# Patient Record
Sex: Female | Born: 1965 | Race: White | Hispanic: No | Marital: Married
Health system: Southern US, Community
[De-identification: ages and names within clinical notes are randomized; demographics above are authoritative.]

---

## 2004-02-05 ENCOUNTER — Ambulatory Visit: Payer: Self-pay

## 2009-01-08 ENCOUNTER — Ambulatory Visit: Payer: Self-pay

## 2010-11-28 ENCOUNTER — Ambulatory Visit: Payer: Self-pay | Admitting: Obstetrics and Gynecology

## 2010-12-04 ENCOUNTER — Ambulatory Visit: Payer: Self-pay | Admitting: Obstetrics and Gynecology

## 2011-06-19 ENCOUNTER — Ambulatory Visit: Payer: Self-pay | Admitting: Obstetrics and Gynecology

## 2012-11-09 ENCOUNTER — Ambulatory Visit: Payer: Self-pay

## 2014-01-11 ENCOUNTER — Ambulatory Visit: Payer: Self-pay

## 2014-01-23 ENCOUNTER — Ambulatory Visit: Payer: Self-pay

## 2015-01-03 ENCOUNTER — Other Ambulatory Visit: Payer: Self-pay | Admitting: Obstetrics and Gynecology

## 2015-01-03 DIAGNOSIS — Z1231 Encounter for screening mammogram for malignant neoplasm of breast: Secondary | ICD-10-CM

## 2015-01-11 ENCOUNTER — Ambulatory Visit
Admission: RE | Admit: 2015-01-11 | Discharge: 2015-01-11 | Disposition: A | Discharge status: Home / Self Care | Payer: BLUE CROSS/BLUE SHIELD | Source: Ambulatory Visit | Attending: Obstetrics and Gynecology | Admitting: Obstetrics and Gynecology

## 2015-01-11 DIAGNOSIS — Z1231 Encounter for screening mammogram for malignant neoplasm of breast: Secondary | ICD-10-CM | POA: Diagnosis not present

## 2016-01-09 ENCOUNTER — Ambulatory Visit: Payer: Self-pay | Attending: Oncology | Admitting: *Deleted

## 2016-01-09 ENCOUNTER — Ambulatory Visit
Admission: RE | Admit: 2016-01-09 | Discharge: 2016-01-09 | Disposition: A | Discharge status: Home / Self Care | Payer: Self-pay | Source: Ambulatory Visit | Attending: Oncology | Admitting: Oncology

## 2016-01-09 VITALS — BP 124/73 | HR 69 | Temp 96.7°F | Ht 68.11 in | Wt 204.4 lb

## 2016-01-09 DIAGNOSIS — Z0000 Encounter for general adult medical examination without abnormal findings: Secondary | ICD-10-CM

## 2016-01-09 NOTE — Progress Notes (Signed)
Subjective:     Patient ID: Brien Fewracy Gregory Corkum, female   DOB: 03/22/1965, 50 y.o.   MRN: [id number]  HPI   Review of Systems     Objective:   Physical Exam  Pulmonary/Chest: Right breast exhibits no inverted nipple, no mass, no nipple discharge, no skin change and no tenderness. Left breast exhibits no inverted nipple, no mass, no nipple discharge, no skin change and no tenderness. Breasts are symmetrical.         Assessment:     50 year old White [facility], pap and mammogram.  Clinical breast exam unremarkable.  Taught self breast awareness.  Pap delayed this week due to the patient is currently spotting.  States she had a novasure procedure and she occasionally spots.  She is to call back next week so we can reschedule her pap when she is not spotting.  She is agreeable.  Patient has been screened for eligibility.  She does not have any insurance, Medicare or Medicaid.  She also meets financial eligibility.  Hand-out given on the Affordable Care Act.    Plan:     Screening mammogram ordered.  Will follow-up per [facility] protocol.

## 2016-01-10 ENCOUNTER — Encounter: Payer: Self-pay | Admitting: *Deleted

## 2016-01-10 NOTE — Progress Notes (Signed)
Letter mailed from the [facility] to inform patient of her normal mammogram results.  Patient is to follow-up with annual screening in one year.  [facility] to [place]. 

## 2016-01-23 ENCOUNTER — Ambulatory Visit: Payer: Self-pay | Attending: Oncology | Admitting: *Deleted

## 2016-01-23 ENCOUNTER — Encounter: Payer: Self-pay | Admitting: *Deleted

## 2016-01-23 DIAGNOSIS — Z0000 Encounter for general adult medical examination without abnormal findings: Secondary | ICD-10-CM

## 2016-01-23 NOTE — Patient Instructions (Signed)
  HPV Test The human papillomavirus (HPV) test is used to look for high-risk types of HPV infection. HPV is a group of about 100 viruses. Many of these viruses cause growths on, in, or around the genitals. Most HPV viruses cause infections that usually go away without treatment. However, HPV types 6, 11, 16, and 18 are considered high-risk types of HPV that can increase your risk of cancer of the cervix or anus if the infection is left untreated. An HPV test identifies the DNA (genetic) strands of the HPV infection, so it is also referred to as the HPV DNA test. Although HPV is found in both males and females, the HPV test is only used to screen for increased cancer risk in females:  With an abnormal Pap test.  After treatment of an abnormal Pap test.  Between the ages of 30 and 65.  After treatment of a high-risk HPV infection.  The HPV test may be done at the same time as a pelvic exam and Pap test in females over the age of 30. Both the HPV test and Pap test require a sample of cells from the cervix. How do I prepare for this test?  Do not douche or take a bath for 24-48 hours before the test or as directed by your health care provider.  Do not have sex for 24-48 hours before the test or as directed by your health care provider.  You may be asked to reschedule the test if you are menstruating.  You will be asked to urinate before the test. What do the results mean? It is your responsibility to obtain your test results. Ask the lab or department performing the test when and how you will get your results. Talk with your health care provider if you have any questions about your results. Your result will be negative or positive. Meaning of Negative Test Results A negative HPV test result means that no HPV was found, and it is very likely that you do not have HPV. Meaning of Positive Test Results A positive HPV test result indicates that you have HPV.  If your test result shows the  presence of any high-risk HPV strains, you may have an increased risk of developing cancer of the cervix or anus if the infection is left untreated.  If any low-risk HPV strains are found, you are not likely to have an increased risk of cancer.  Discuss your test results with your health care provider. He or she will use the results to make a diagnosis and determine a treatment plan that is right for you. Talk with your health care provider to discuss your results, treatment options, and if necessary, the need for more tests. Talk with your health care provider if you have any questions about your results. This information is not intended to replace advice given to you by your health care provider. Make sure you discuss any questions you have with your health care provider. Document Released: 01/25/2004 Document Revised: 09/05/2015 Document Reviewed: 05/17/2013 Elsevier Interactive Patient Education  2017 [employer]  

## 2016-01-23 NOTE — Progress Notes (Signed)
Subjective:     Patient ID: Brenda Sexton, female   DOB: 03/09/1965, 51 y.o.   MRN: [id number]  HPI   Review of Systems     Objective:   Physical Exam  Genitourinary: No labial fusion. There is no rash, tenderness, lesion or injury on the right labia. There is no rash, tenderness, lesion or injury on the left labia. Right adnexum displays no mass, no tenderness and no fullness. Left adnexum displays no mass, no tenderness and no fullness. No erythema, tenderness or bleeding in the vagina. No foreign body in the vagina. No signs of injury around the vagina. No vaginal discharge found.         Assessment:     51 [facility]  Specimen collected for pap without difficulty.  Informed of her normal mammogram results on 01/09/16.  Hand-out given on HPV.       Plan:     Specimen sent to the lab.  Will follow-up per [facility] protocol.

## 2016-01-25 LAB — PAP LB AND HPV HIGH-RISK
HPV, high-risk: NEGATIVE
PAP SMEAR COMMENT: 0

## 2016-01-28 ENCOUNTER — Encounter: Payer: Self-pay | Admitting: *Deleted

## 2016-01-28 NOTE — Progress Notes (Signed)
Letter mailed to inform patient of her normal pap smear results.  Next pap will be in 5 years.  [facility] [place]

## 2017-04-27 ENCOUNTER — Ambulatory Visit: Payer: Self-pay | Attending: Oncology

## 2017-04-27 ENCOUNTER — Ambulatory Visit
Admission: RE | Admit: 2017-04-27 | Discharge: 2017-04-27 | Disposition: A | Discharge status: Home / Self Care | Payer: Self-pay | Source: Ambulatory Visit | Attending: Oncology | Admitting: Oncology

## 2017-04-27 VITALS — BP 139/83 | HR 73 | Temp 98.3°F | Ht 67.72 in | Wt 182.2 lb

## 2017-04-27 DIAGNOSIS — Z0000 Encounter for general adult medical examination without abnormal findings: Secondary | ICD-10-CM

## 2017-04-27 NOTE — Progress Notes (Signed)
Subjective:     Patient ID: Brenda Sexton, female   DOB: 01/05/1966, 52 y.o.   MRN: [id number]  HPI   Review of Systems     Objective:   Physical Exam  Pulmonary/Chest: Right breast exhibits no inverted nipple, no mass, no nipple discharge, no skin change and no tenderness. Left breast exhibits no inverted nipple, no mass, no nipple discharge, no skin change and no tenderness. Breasts are symmetrical.       Assessment:     52 [facility].  Patient screened, and meets [facility] eligibility.  Patient does not have insurance, Medicare or Medicaid.  Handout given on Affordable Care Act.  Instructed patient on breast self awareness using teach back method.  Clinical Breast Exam unremarkable.  No mass or lump palpated.  Patient to see Primary caregiver for physical.    Plan:     Sent for bilateral screening mammogram.

## 2017-04-29 NOTE — Progress Notes (Signed)
Letter mailed from [facility] to notify of normal mammogram results.  Patient to return in one year for annual screening.  Copy to [facility]IS. 

## 2019-07-20 ENCOUNTER — Other Ambulatory Visit: Payer: Self-pay

## 2019-07-20 ENCOUNTER — Ambulatory Visit
Admission: RE | Admit: 2019-07-20 | Discharge: 2019-07-20 | Disposition: A | Discharge status: Home / Self Care | Payer: Self-pay | Source: Ambulatory Visit | Attending: Oncology | Admitting: Oncology

## 2019-07-20 ENCOUNTER — Ambulatory Visit: Payer: Self-pay | Attending: Oncology

## 2019-07-20 VITALS — BP 124/75 | HR 75 | Temp 98.6°F | Ht 67.5 in | Wt 200.0 lb

## 2019-07-20 DIAGNOSIS — Z0000 Encounter for general adult medical examination without abnormal findings: Secondary | ICD-10-CM | POA: Insufficient documentation

## 2019-07-20 NOTE — Progress Notes (Signed)
  Subjective:     Patient ID: Brien Few, female   DOB: 1965-09-16, 54 y.o.   MRN: [id number]  HPI   Review of Systems     Objective:   Physical Exam Chest:     Breasts:        Right: No swelling, bleeding, inverted nipple, mass, nipple discharge, skin change or tenderness.        Left: No swelling, bleeding, inverted nipple, mass, nipple discharge, skin change or tenderness.        Assessment:     54 year old patient presents for [facility] clinic visit.  Patient screened, and meets [facility] eligibility.  Patient does not have insurance, Medicare or Medicaid. Instructed patient on breast self awareness using teach back method.  Clinical breast exam unremarkable. No mass or lump palpated.   Risk Assessment    Risk Scores      07/20/2019   Last edited by: Alta Corning, CMA   5-year risk: 0.9 %   Lifetime risk: 7.8 %            Plan:     Sent for bilateral screening mammogram.

## 2019-07-26 NOTE — Progress Notes (Signed)
Letter mailed from [facility] to notify of normal mammogram results.  Patient to return in one year for annual screening.  Copy to [facility]IS. 

## 2020-12-30 IMAGING — MG DIGITAL SCREENING BILAT W/ TOMO W/ CAD
8 series · 8 of 24 positions shown · non-contrast
Comparison: Previous exam(s).

CLINICAL DATA: Screening.

EXAM:
DIGITAL SCREENING BILATERAL MAMMOGRAM WITH TOMO AND CAD

[R CC synth-2D]
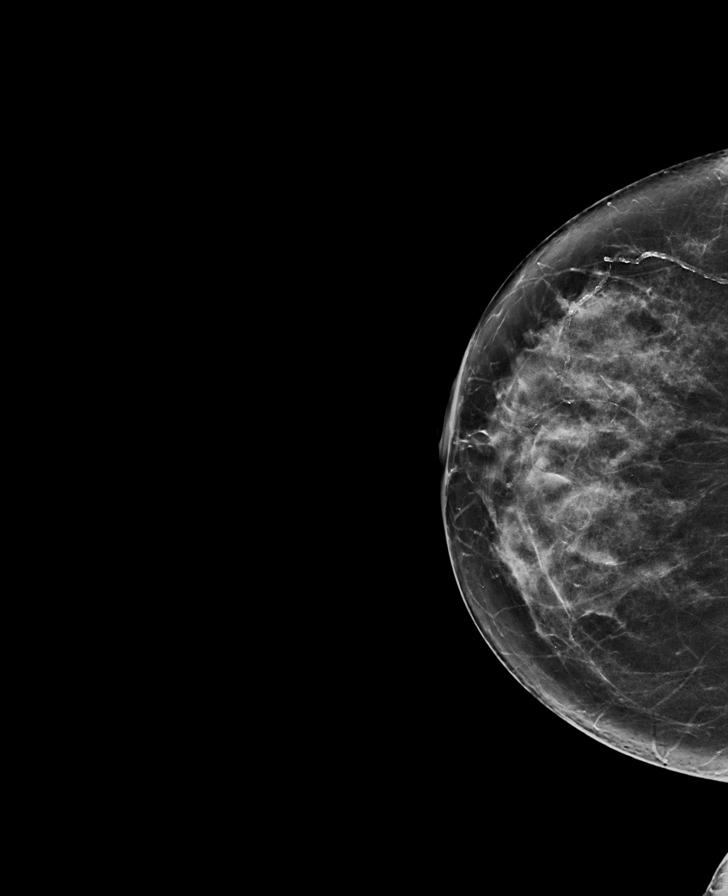

[L CC synth-2D]
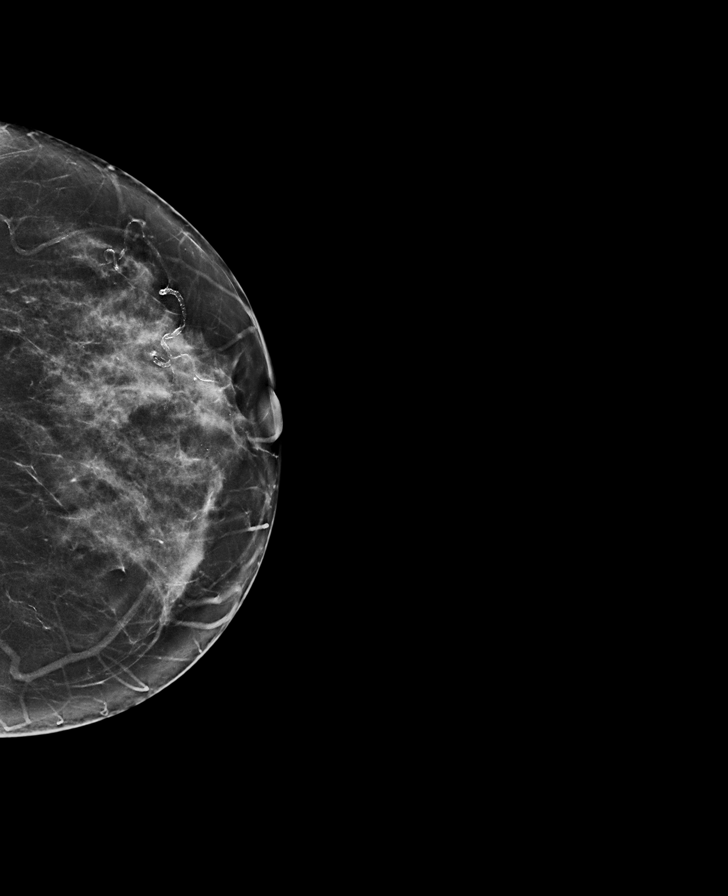

[L MLO synth-2D]
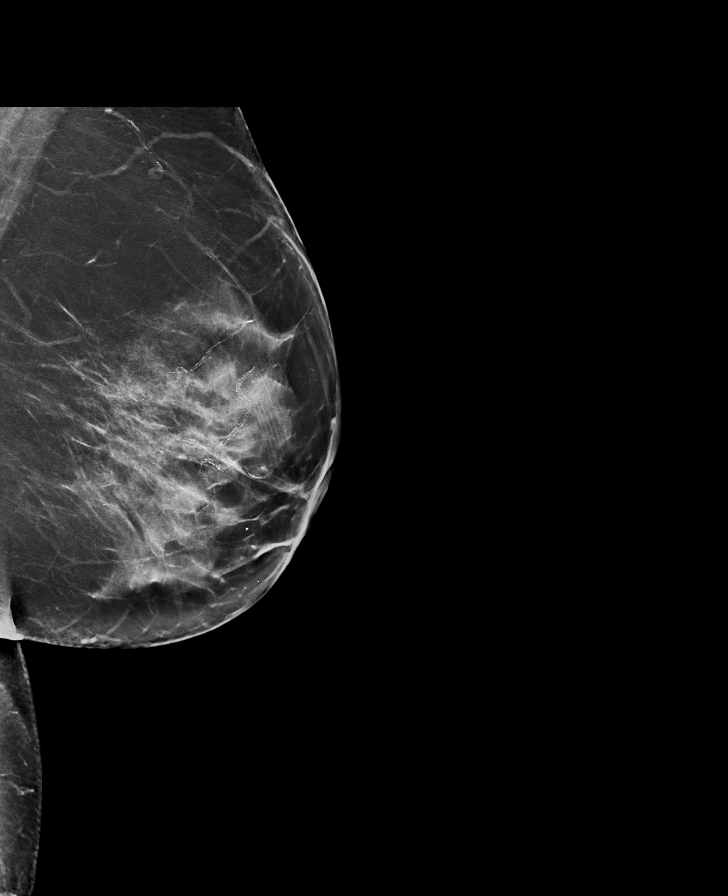

[R MLO synth-2D]
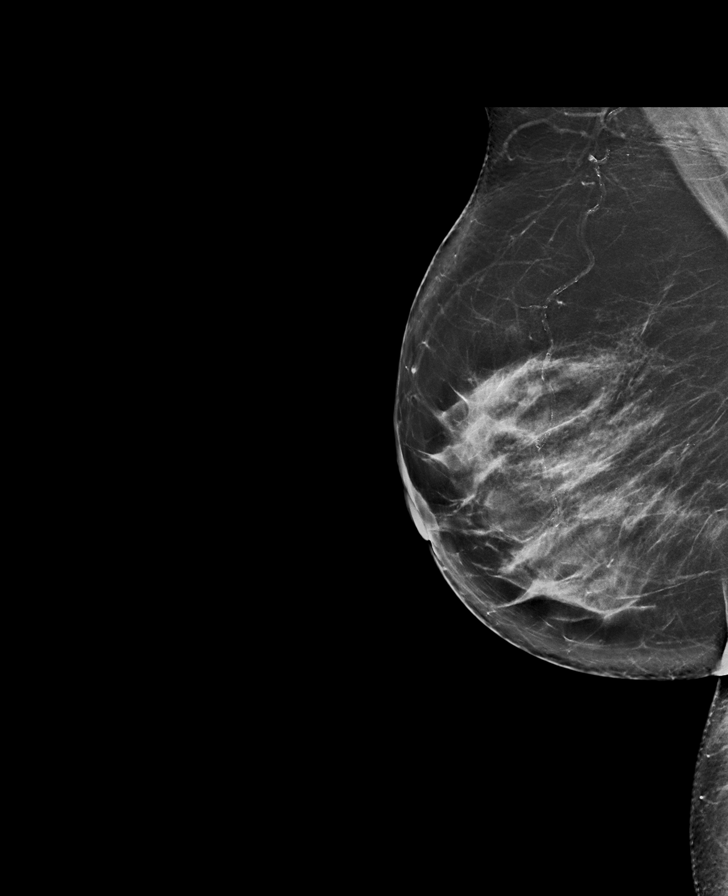

[L MLO tomo · tomo slice 43/86.0]
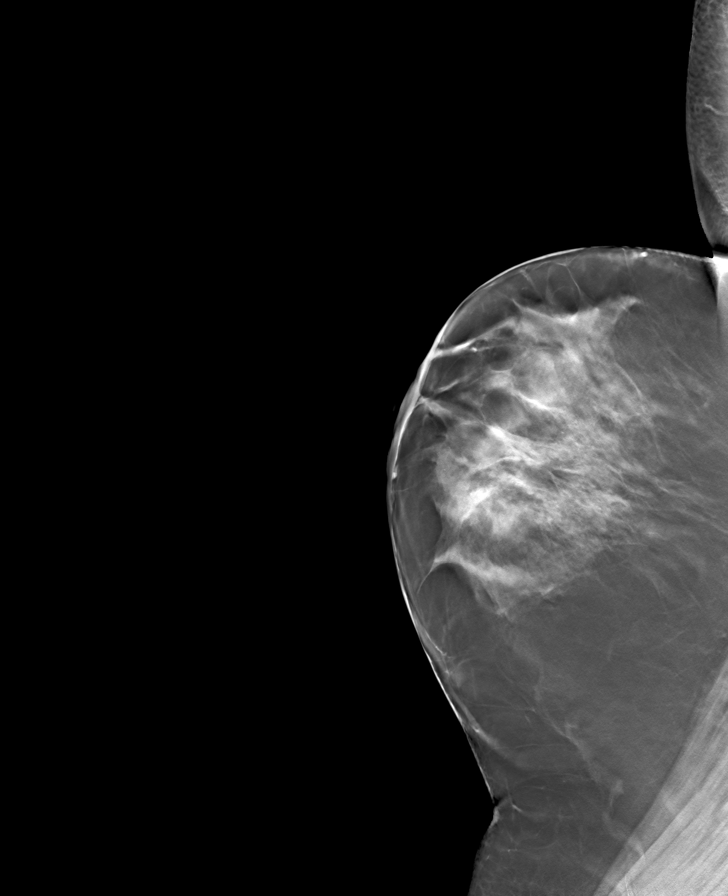

[R CC tomo · tomo slice 37/72.0]
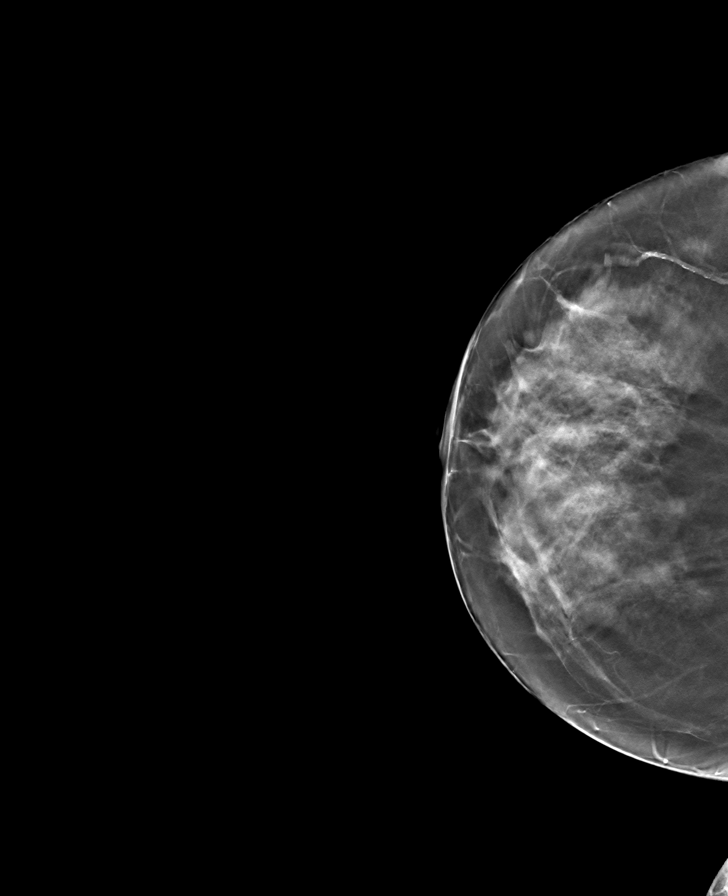

[L CC tomo · tomo slice 37/72.0]
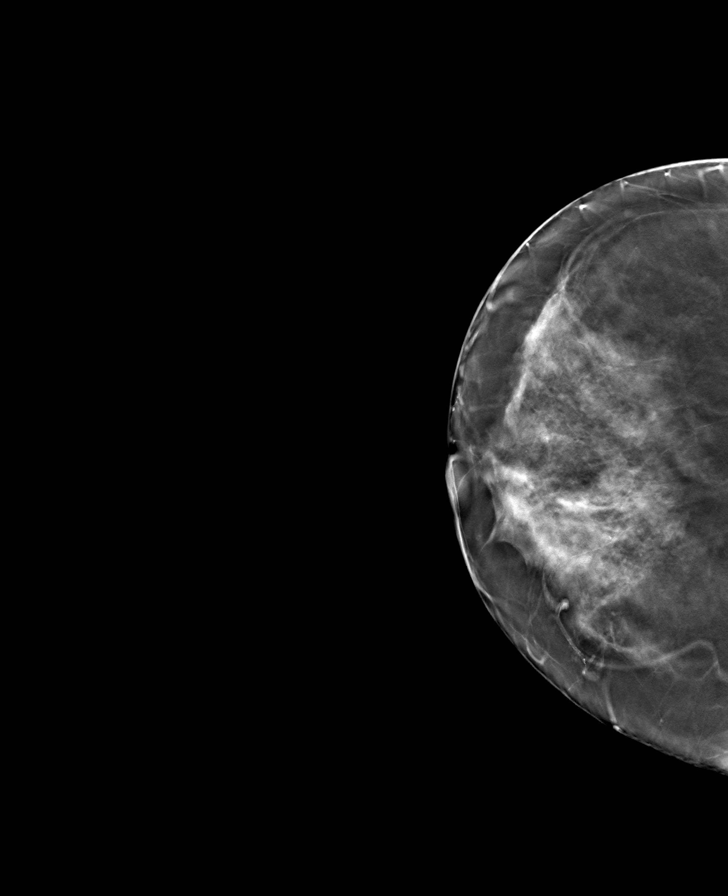

[R MLO tomo · tomo slice 41/82.0]
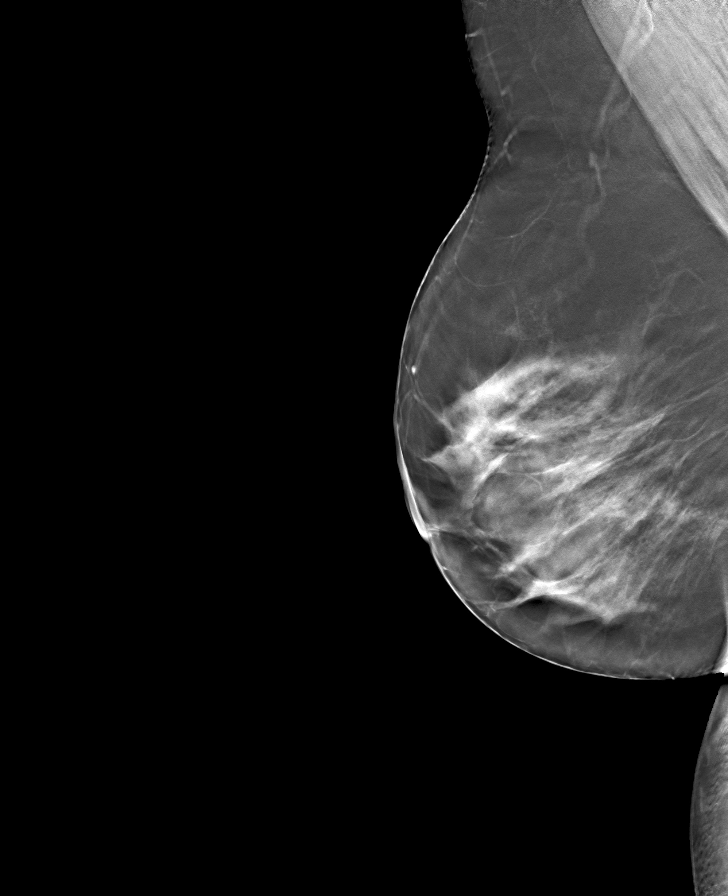

[8 of 24 positions shown; findings below may reference images not displayed]

ACR Breast Density Category c: The breast tissue is heterogeneously
dense, which may obscure small masses.
FINDINGS: There are no findings suspicious for malignancy. Images were
processed with CAD.
IMPRESSION: No mammographic evidence of malignancy. A result letter of this
screening mammogram will be mailed directly to the patient.

RECOMMENDATION:
Screening mammogram in one year. (Code:FT-U-LHB)

BI-RADS CATEGORY  1: Negative.
